# Patient Record
Sex: Female | Born: 1960 | Race: White | Hispanic: No | Marital: Married | Smoking: Never smoker
Health system: Southern US, Community
[De-identification: ages and names within clinical notes are randomized; demographics above are authoritative.]

## PROBLEM LIST (undated history)

## (undated) DIAGNOSIS — N189 Chronic kidney disease, unspecified: Secondary | ICD-10-CM

## (undated) DIAGNOSIS — I493 Ventricular premature depolarization: Secondary | ICD-10-CM

## (undated) DIAGNOSIS — R0789 Other chest pain: Secondary | ICD-10-CM

## (undated) HISTORY — DX: Chronic kidney disease, unspecified: N18.9

## (undated) HISTORY — DX: Other chest pain: R07.89

## (undated) HISTORY — DX: Ventricular premature depolarization: I49.3

---

## 1997-11-24 ENCOUNTER — Other Ambulatory Visit: Admission: RE | Admit: 1997-11-24 | Discharge: 1997-11-24 | Payer: Self-pay | Admitting: Obstetrics and Gynecology

## 1999-10-31 ENCOUNTER — Other Ambulatory Visit: Admission: RE | Admit: 1999-10-31 | Discharge: 1999-10-31 | Payer: Self-pay | Admitting: Obstetrics and Gynecology

## 2000-01-02 ENCOUNTER — Ambulatory Visit (HOSPITAL_COMMUNITY): Admission: RE | Admit: 2000-01-02 | Discharge: 2000-01-02 | Payer: Self-pay | Admitting: Family Medicine

## 2000-01-02 ENCOUNTER — Encounter: Payer: Self-pay | Admitting: Family Medicine

## 2000-10-08 ENCOUNTER — Emergency Department (HOSPITAL_COMMUNITY): Admission: EM | Admit: 2000-10-08 | Discharge: 2000-10-09 | Payer: Self-pay

## 2000-10-09 ENCOUNTER — Encounter: Payer: Self-pay | Admitting: Emergency Medicine

## 2000-11-14 ENCOUNTER — Other Ambulatory Visit: Admission: RE | Admit: 2000-11-14 | Discharge: 2000-11-14 | Payer: Self-pay | Admitting: Obstetrics and Gynecology

## 2001-12-11 ENCOUNTER — Other Ambulatory Visit: Admission: RE | Admit: 2001-12-11 | Discharge: 2001-12-11 | Payer: Self-pay | Admitting: Obstetrics and Gynecology

## 2002-11-12 ENCOUNTER — Other Ambulatory Visit: Admission: RE | Admit: 2002-11-12 | Discharge: 2002-11-12 | Payer: Self-pay | Admitting: Obstetrics and Gynecology

## 2003-11-09 ENCOUNTER — Other Ambulatory Visit: Admission: RE | Admit: 2003-11-09 | Discharge: 2003-11-09 | Payer: Self-pay | Admitting: Obstetrics and Gynecology

## 2005-01-02 ENCOUNTER — Other Ambulatory Visit: Admission: RE | Admit: 2005-01-02 | Discharge: 2005-01-02 | Payer: Self-pay | Admitting: Obstetrics and Gynecology

## 2005-01-17 ENCOUNTER — Encounter: Admission: RE | Admit: 2005-01-17 | Discharge: 2005-01-17 | Payer: Self-pay | Admitting: Obstetrics and Gynecology

## 2005-07-11 ENCOUNTER — Encounter (INDEPENDENT_AMBULATORY_CARE_PROVIDER_SITE_OTHER): Payer: Self-pay | Admitting: *Deleted

## 2005-07-11 ENCOUNTER — Encounter: Admission: RE | Admit: 2005-07-11 | Discharge: 2005-07-11 | Payer: Self-pay | Admitting: Obstetrics and Gynecology

## 2005-08-08 ENCOUNTER — Encounter (INDEPENDENT_AMBULATORY_CARE_PROVIDER_SITE_OTHER): Payer: Self-pay | Admitting: *Deleted

## 2005-08-08 ENCOUNTER — Ambulatory Visit (HOSPITAL_BASED_OUTPATIENT_CLINIC_OR_DEPARTMENT_OTHER): Admission: RE | Admit: 2005-08-08 | Discharge: 2005-08-08 | Payer: Self-pay | Admitting: Surgery

## 2005-08-08 ENCOUNTER — Encounter: Admission: RE | Admit: 2005-08-08 | Discharge: 2005-08-08 | Payer: Self-pay | Admitting: Surgery

## 2005-08-16 ENCOUNTER — Encounter: Admission: RE | Admit: 2005-08-16 | Discharge: 2005-08-16 | Payer: Self-pay | Admitting: Obstetrics and Gynecology

## 2005-09-17 ENCOUNTER — Encounter: Admission: RE | Admit: 2005-09-17 | Discharge: 2005-09-17 | Payer: Self-pay | Admitting: Surgery

## 2006-07-25 ENCOUNTER — Encounter: Admission: RE | Admit: 2006-07-25 | Discharge: 2006-07-25 | Payer: Self-pay | Admitting: Obstetrics and Gynecology

## 2007-10-12 ENCOUNTER — Encounter: Admission: RE | Admit: 2007-10-12 | Discharge: 2007-10-12 | Payer: Self-pay | Admitting: Obstetrics and Gynecology

## 2008-11-14 ENCOUNTER — Encounter: Admission: RE | Admit: 2008-11-14 | Discharge: 2008-11-14 | Payer: Self-pay | Admitting: Obstetrics and Gynecology

## 2010-01-29 ENCOUNTER — Encounter: Admission: RE | Admit: 2010-01-29 | Discharge: 2010-01-29 | Payer: Self-pay | Admitting: Obstetrics and Gynecology

## 2010-07-20 NOTE — Op Note (Signed)
NAME:  Paige Walker, Paige Walker               ACCOUNT NO.:  [id number]   MEDICAL RECORD NO.:  [id number]          PATIENT TYPE:  AMB   LOCATION:  DSC                          FACILITY:  [facility]   PHYSICIAN:  Currie Paris, M.D.DATE OF BIRTH:  [birth year]   DATE OF PROCEDURE:  08/08/2005  DATE OF DISCHARGE:                                 OPERATIVE REPORT   CCS#:  [id number].   PREOPERATIVE DIAGNOSES:  Left breast mass, cyst with a background of the  sclerotic lesion by  core biopsy.   POSTOPERATIVE DIAGNOSES:  Left breast mass, cyst with a background of the  sclerotic lesion by  core biopsy.   OPERATION:  Needle guided excision left breast mass.   SURGEON:  Dr. Jamey Ripa.   ANESTHESIA:  MAC.   CLINICAL HISTORY:  Ms. Wax is a 50 year old lady who recently had an  abnormality and an irregular cyst was found and the biopsy showed a  sclerosing lesion.  Excision was thought appropriate for confirmation that  this was benign.  She had no palpable mass.  A clip had been left in the  area at the time of her initial biopsy.   DESCRIPTION OF PROCEDURE:  The patient was seen in the holding area and she  had no further questions. We both identified the left breast and that was  marked as the operative side.  She already had her guidewire placed.   The guidewire entered fairly high at about the 12:30 position and tracked  deep and inferior.  Using the mammograms, the clip appeared to be a couple  of centimeters below the guidewire entry site.   The patient was then taken to the operating room and IV sedation given.  The  left breast was then prepped and draped as a sterile field.  The time-out  occurred.   I infiltrated the area with a combination 1% plain Xylocaine and 0.5%  Marcaine with epi mixed equally.  I started the areolar edge and infiltrated  it all the way up to the guidewire entry site and then deep into the breast  tissue.  I made the incision about a centimeter above the areolar  margin.  The guidewire actually entered about 5-6 cm above this.  This was as low as  I thought I could go and still safely reach and get the area in question out  by raising a skin flap.   Once the incision was made, I did raise a superficial skin flap up to the  guidewire and manipulated it into the wound.  Using an Allis for some  traction,  I then did an excisional biopsy taking the tissue out around the  guidewire starting superiorly and then working inferiorly and going both  medial and lateral. I thought that the area in question would be above my  skin incision as I thought I was well below that area so I was careful to  take all tissue anterior except for the small area left on the skin.   Once I got to where I thought I was past the guidewire, I completed the  excision by dividing the more inferior breast tissue and sent this for  specimen mammography.  I did put some orienting sutures on.  As I was taking  it out, I noticed what appeared to be some old hemorrhage at about the level  where I thought the prior biopsy had been and also seemed to be on the very  medial aspect of my specimen.   While waiting for the radiologist to do the specimen mammography, I went  ahead and took a little extra tissue right where the prior biopsy site  appeared to have been just so we had clear margins at that sites should any  pathology show malignancy.   Additional local had been infiltrated as I worked.  The bottom ore deep  portion of the incision was on the fascia but I did not take fascia.   At this point, I closed incision using some 3-0 Vicryl to close the very  deeper layers of the breast tissue over the muscle, some 3-0 Vicryl on the  subcu and then some 4-0 Monocryl subcuticular plus Dermabond for the skin.   The patient the tolerated procedure well.  There were no operative  complications.  All counts were correct.  Radiology called to report that  the specimen appeared  appropriate.      Currie Paris, M.D.  Electronically Signed     CJS/MEDQ  D:  08/08/2005  T:  08/09/2005  Job:  [id number]   cc:   Molly Maduro A. Nicholos Johns, M.D.  Fax: [phone]   Duke Salvia. Marcelle Overlie, M.D.  Fax: [phone]

## 2010-07-20 NOTE — Consult Note (Signed)
[facility]  Patient:    Paige Walker, Paige Walker                      MRN: [id number] Proc. Date: 10/09/00 Adm. Date:  [id number] Disc. Date: [id number] Attending:  Pearletha Alfred CC:         Duke Salvia. Marcelle Overlie, M.D.   Consultation Report  CHIEF COMPLAINT:  Abdominal pain.  HISTORY OF PRESENT ILLNESS:  This is a healthy 50 year old white female who was perfectly well until yesterday evening at 7:30 p.m. after dinner, when she developed the gradual onset of dull, diffuse, nonlocalized abdominal pain. This pain became progressive but remained nonlocalized.  She came to the emergency room at 10:30 p.m. last night and was evaluated by the emergency department physician.  She states that she was nauseated somewhat but did not vomit.  She states that the nausea has now resolved.  She states that her pain is much, much better now but still has "twinges" of pain in the right lower quadrant.  She states that she is not having any diarrhea, her bowel movements are infrequent but did have a bowel movement yesterday.  Denies fever or chills.  Denies vaginal discharge.  Last menstrual period two weeks ago.  She is not on birth control pills.  No prior episodes.  She had a CT scan during the night and that was interpreted by Dr. Ronney Asters as showing a somewhat distended appendix, possibly indicating appendicitis.  I have reviewed this CT scan myself and I am unsure.  I have reviewed the CT scan with Dr. Sherrine Maples T. Fredia Sorrow and the appendix is about 1.0 cm in diameter. There is a question of whether it is thick-walled.  There is no inflammatory change, there is no fluid collection and there are no other abnormalities on CT scan and it is felt that this CT scan is not diagnostic for appendicitis.  I was asked to see he by Dr. Estell Harpin.  PAST HISTORY:  Patient had a cesarean section in 1993.  She suffered a ruptured uterus and bladder in 1997 during a vaginal delivery which  required laparotomy by Dr. Duke Salvia. [place] and Dr. Excell Seltzer. [place].  She a tonsillectomy.  She had lithotripsy x 2 for kidney stones.  She has a history of nasal surgery.  She had a kidney biopsy for nephritis at age 38 but that is not an ongoing problem.  CURRENT MEDICATIONS:  Wellbutrin.  DRUG ALLERGIES:  None known.  FAMILY HISTORY:  Mother living and has osteoporosis.  Father died at age 56 of congestive heart failure.  All siblings are well.  SOCIAL HISTORY:  The patient is married and has two children.  She is a housewife.  She denies the use of tobacco.  Drinks alcohol occasionally.  REVIEW OF SYSTEMS:  She does have some fatty food intolerance which causes cramps and diarrhea.  PHYSICAL EXAMINATION:  GENERAL:  Very healthy young woman in no distress whatsoever.  She moves about easily and does not appear to be in pain.  VITAL SIGNS:  Temperature 97.3, respirations 20, pulse 78, blood pressure 114/73.  HEENT:  Sclerae clear.  Extraocular movements intact.  Oropharynx clear.  NECK:  Supple.  Nontender.  No masses, no adenopathy, no jugular venous distention.  LUNGS:  Clear to auscultation.  No CVA tenderness.  HEART:  Regular rate and rhythm.  No murmur.  BREASTS:  Not examined.  ABDOMEN:  Scaphoid, quit soft.  Active bowel sounds.  There is a well-healed short Pfannenstiel incision below the hair line.  She has some subjective tenderness in the right lower quadrant but objectively, she is not very tender.  There is no guarding, no rebound, no mass, no hernia.  Abdominal exam is not very impressive.  EXTREMITIES:  No edema.  Good pulses.  NEUROLOGIC:  Grossly within normal limits.  LABORATORY DATA:  Urine pregnancy test negative.  Urinalysis clear, no blood. WBC elevated at 16,700.  Complete metabolic panel normal.  CT scan as described above.  IMPRESSION:  Abdominal pain of uncertain etiology.  History and exam are atypical for appendicitis.  Question  whether this may be a gastroenteritis or a ruptured corpus luteum cyst.  PLAN:  I advised the patient and her husband that I thought that appendicitis was not likely, although I could not completely rule it out.  I advised her that the best course of action at this time would be observation and that could be done in the hospital or at home, since they live close by.  She stated that she was feeling much better and would like to go home and I felt that that was reasonably safe.  She is advised to stay on a liquid diet for the next 12 hours and to take Tylenol for pain.  She is advised to call me or return to the emergency room if symptoms recur.  If she does not resolve, she certainly needs to be considered for a diagnostic laparoscopy.  If her symptoms completely resolve, she may follow up with me p.r.n. DD:  10/09/00 TD:  10/09/00 Job: [id number]

## 2011-01-14 ENCOUNTER — Other Ambulatory Visit: Payer: Self-pay | Admitting: Obstetrics and Gynecology

## 2011-01-14 DIAGNOSIS — Z1231 Encounter for screening mammogram for malignant neoplasm of breast: Secondary | ICD-10-CM

## 2011-02-07 ENCOUNTER — Ambulatory Visit
Admission: RE | Admit: 2011-02-07 | Discharge: 2011-02-07 | Disposition: A | Discharge status: Home / Self Care | Payer: BC Managed Care – PPO | Source: Ambulatory Visit | Attending: Obstetrics and Gynecology | Admitting: Obstetrics and Gynecology

## 2011-02-07 DIAGNOSIS — Z1231 Encounter for screening mammogram for malignant neoplasm of breast: Secondary | ICD-10-CM

## 2012-03-24 ENCOUNTER — Other Ambulatory Visit: Payer: Self-pay | Admitting: Obstetrics and Gynecology

## 2012-03-24 DIAGNOSIS — Z9889 Other specified postprocedural states: Secondary | ICD-10-CM

## 2012-03-24 DIAGNOSIS — Z1231 Encounter for screening mammogram for malignant neoplasm of breast: Secondary | ICD-10-CM

## 2012-03-25 ENCOUNTER — Ambulatory Visit
Admission: RE | Admit: 2012-03-25 | Discharge: 2012-03-25 | Disposition: A | Discharge status: Home / Self Care | Payer: BC Managed Care – PPO | Source: Ambulatory Visit | Attending: Obstetrics and Gynecology | Admitting: Obstetrics and Gynecology

## 2012-03-25 DIAGNOSIS — Z1231 Encounter for screening mammogram for malignant neoplasm of breast: Secondary | ICD-10-CM

## 2012-03-25 DIAGNOSIS — Z9889 Other specified postprocedural states: Secondary | ICD-10-CM

## 2012-03-27 ENCOUNTER — Other Ambulatory Visit: Payer: Self-pay | Admitting: Obstetrics and Gynecology

## 2012-03-27 DIAGNOSIS — R928 Other abnormal and inconclusive findings on diagnostic imaging of breast: Secondary | ICD-10-CM

## 2012-04-01 ENCOUNTER — Ambulatory Visit
Admission: RE | Admit: 2012-04-01 | Discharge: 2012-04-01 | Disposition: A | Discharge status: Home / Self Care | Payer: BC Managed Care – PPO | Source: Ambulatory Visit | Attending: Obstetrics and Gynecology | Admitting: Obstetrics and Gynecology

## 2012-04-01 DIAGNOSIS — R928 Other abnormal and inconclusive findings on diagnostic imaging of breast: Secondary | ICD-10-CM

## 2012-04-10 ENCOUNTER — Other Ambulatory Visit: Payer: BC Managed Care – PPO

## 2012-04-17 ENCOUNTER — Ambulatory Visit: Payer: BC Managed Care – PPO

## 2013-10-17 ENCOUNTER — Encounter: Payer: Self-pay | Admitting: *Deleted

## 2014-04-26 ENCOUNTER — Other Ambulatory Visit: Payer: Self-pay

## 2014-04-26 DIAGNOSIS — Z1231 Encounter for screening mammogram for malignant neoplasm of breast: Secondary | ICD-10-CM

## 2014-05-02 ENCOUNTER — Encounter (INDEPENDENT_AMBULATORY_CARE_PROVIDER_SITE_OTHER): Payer: Self-pay

## 2014-05-02 ENCOUNTER — Ambulatory Visit
Admission: RE | Admit: 2014-05-02 | Discharge: 2014-05-02 | Disposition: A | Discharge status: Home / Self Care | Payer: BLUE CROSS/BLUE SHIELD | Source: Ambulatory Visit

## 2014-05-02 DIAGNOSIS — Z1231 Encounter for screening mammogram for malignant neoplasm of breast: Secondary | ICD-10-CM

## 2016-01-02 ENCOUNTER — Other Ambulatory Visit: Payer: Self-pay | Admitting: Obstetrics and Gynecology

## 2016-01-02 DIAGNOSIS — Z1231 Encounter for screening mammogram for malignant neoplasm of breast: Secondary | ICD-10-CM

## 2016-01-31 ENCOUNTER — Ambulatory Visit
Admission: RE | Admit: 2016-01-31 | Discharge: 2016-01-31 | Disposition: A | Discharge status: Home / Self Care | Payer: BLUE CROSS/BLUE SHIELD | Source: Ambulatory Visit | Attending: Obstetrics and Gynecology | Admitting: Obstetrics and Gynecology

## 2016-01-31 DIAGNOSIS — Z1231 Encounter for screening mammogram for malignant neoplasm of breast: Secondary | ICD-10-CM

## 2020-04-27 ENCOUNTER — Other Ambulatory Visit: Payer: Self-pay | Admitting: Family Medicine

## 2020-04-27 DIAGNOSIS — Z8249 Family history of ischemic heart disease and other diseases of the circulatory system: Secondary | ICD-10-CM

## 2020-04-28 ENCOUNTER — Other Ambulatory Visit: Payer: Self-pay | Admitting: Family Medicine

## 2020-04-28 DIAGNOSIS — Z0000 Encounter for general adult medical examination without abnormal findings: Secondary | ICD-10-CM

## 2020-05-09 ENCOUNTER — Ambulatory Visit
Admission: RE | Admit: 2020-05-09 | Discharge: 2020-05-09 | Disposition: A | Discharge status: Home / Self Care | Payer: PRIVATE HEALTH INSURANCE | Source: Ambulatory Visit | Attending: Family Medicine | Admitting: Family Medicine

## 2020-05-09 DIAGNOSIS — Z8249 Family history of ischemic heart disease and other diseases of the circulatory system: Secondary | ICD-10-CM

## 2020-06-14 ENCOUNTER — Other Ambulatory Visit (HOSPITAL_COMMUNITY)
Admission: RE | Admit: 2020-06-14 | Discharge: 2020-06-14 | Disposition: A | Discharge status: Home / Self Care | Payer: PRIVATE HEALTH INSURANCE | Source: Ambulatory Visit | Attending: Family Medicine | Admitting: Family Medicine

## 2020-06-14 DIAGNOSIS — Z1151 Encounter for screening for human papillomavirus (HPV): Secondary | ICD-10-CM | POA: Insufficient documentation

## 2020-06-14 DIAGNOSIS — Z01419 Encounter for gynecological examination (general) (routine) without abnormal findings: Secondary | ICD-10-CM | POA: Diagnosis not present

## 2020-06-14 DIAGNOSIS — Z0000 Encounter for general adult medical examination without abnormal findings: Secondary | ICD-10-CM | POA: Diagnosis present

## 2020-06-16 LAB — CYTOLOGY - PAP
Comment: NEGATIVE
Diagnosis: NEGATIVE
High risk HPV: NEGATIVE

## 2020-06-20 ENCOUNTER — Ambulatory Visit
Admission: RE | Admit: 2020-06-20 | Discharge: 2020-06-20 | Disposition: A | Discharge status: Home / Self Care | Payer: PRIVATE HEALTH INSURANCE | Source: Ambulatory Visit | Attending: Family Medicine | Admitting: Family Medicine

## 2020-06-20 ENCOUNTER — Other Ambulatory Visit: Payer: Self-pay

## 2020-06-20 DIAGNOSIS — Z0000 Encounter for general adult medical examination without abnormal findings: Secondary | ICD-10-CM

## 2020-07-21 ENCOUNTER — Emergency Department (HOSPITAL_COMMUNITY): Payer: PRIVATE HEALTH INSURANCE

## 2020-07-21 ENCOUNTER — Other Ambulatory Visit: Payer: Self-pay

## 2020-07-21 ENCOUNTER — Emergency Department (HOSPITAL_COMMUNITY)
Admission: EM | Admit: 2020-07-21 | Discharge: 2020-07-21 | Disposition: A | Discharge status: Home / Self Care | Payer: PRIVATE HEALTH INSURANCE | Attending: Emergency Medicine | Admitting: Emergency Medicine

## 2020-07-21 ENCOUNTER — Encounter (HOSPITAL_COMMUNITY): Payer: Self-pay | Admitting: Emergency Medicine

## 2020-07-21 DIAGNOSIS — S61411A Laceration without foreign body of right hand, initial encounter: Secondary | ICD-10-CM | POA: Insufficient documentation

## 2020-07-21 DIAGNOSIS — S0083XA Contusion of other part of head, initial encounter: Secondary | ICD-10-CM | POA: Diagnosis not present

## 2020-07-21 DIAGNOSIS — N189 Chronic kidney disease, unspecified: Secondary | ICD-10-CM | POA: Diagnosis not present

## 2020-07-21 DIAGNOSIS — Y92009 Unspecified place in unspecified non-institutional (private) residence as the place of occurrence of the external cause: Secondary | ICD-10-CM | POA: Diagnosis not present

## 2020-07-21 DIAGNOSIS — W010XXA Fall on same level from slipping, tripping and stumbling without subsequent striking against object, initial encounter: Secondary | ICD-10-CM | POA: Insufficient documentation

## 2020-07-21 DIAGNOSIS — W19XXXA Unspecified fall, initial encounter: Secondary | ICD-10-CM

## 2020-07-21 DIAGNOSIS — S6991XA Unspecified injury of right wrist, hand and finger(s), initial encounter: Secondary | ICD-10-CM | POA: Diagnosis present

## 2020-07-21 MED ORDER — CEPHALEXIN 250 MG PO CAPS
500.0000 mg | ORAL_CAPSULE | Freq: Once | ORAL | Status: AC
  Administered 2020-07-21: 500 mg via ORAL
  Filled 2020-07-21: qty 2

## 2020-07-21 MED ORDER — LIDOCAINE-EPINEPHRINE (PF) 2 %-1:200000 IJ SOLN
10.0000 mL | Freq: Once | INTRAMUSCULAR | Status: AC
  Administered 2020-07-21: 10 mL via INTRADERMAL
  Filled 2020-07-21: qty 20

## 2020-07-21 MED ORDER — OXYCODONE-ACETAMINOPHEN 5-325 MG PO TABS
1.0000 | ORAL_TABLET | Freq: Once | ORAL | Status: AC
  Administered 2020-07-21: 1 via ORAL
  Filled 2020-07-21: qty 1

## 2020-07-21 MED ORDER — HYDROMORPHONE HCL 1 MG/ML IJ SOLN
1.0000 mg | Freq: Once | INTRAMUSCULAR | Status: AC
  Administered 2020-07-21: 1 mg via INTRAMUSCULAR
  Filled 2020-07-21: qty 1

## 2020-07-21 MED ORDER — BACITRACIN ZINC 500 UNIT/GM EX OINT
TOPICAL_OINTMENT | Freq: Once | CUTANEOUS | Status: AC
  Filled 2020-07-21: qty 0.9

## 2020-07-21 MED ORDER — CEPHALEXIN 500 MG PO CAPS: 500.0000 mg | ORAL_CAPSULE | Freq: Four times a day (QID) | ORAL | 0 refills | Status: AC

## 2020-07-21 MED ORDER — TETANUS-DIPHTH-ACELL PERTUSSIS 5-2.5-18.5 LF-MCG/0.5 IM SUSY
0.5000 mL | PREFILLED_SYRINGE | Freq: Once | INTRAMUSCULAR | Status: DC
  Filled 2020-07-21: qty 0.5

## 2020-07-21 MED ORDER — HYDROCODONE-ACETAMINOPHEN 5-325 MG PO TABS
1.0000 | ORAL_TABLET | Freq: Once | ORAL | Status: AC
  Administered 2020-07-21: 1 via ORAL
  Filled 2020-07-21: qty 1

## 2020-07-21 NOTE — ED Triage Notes (Signed)
Patient tripped and fell at home this evening with brief LOC , presents with multiple skin lacerations at right palm sustained from a glass that she was holding .

## 2020-07-21 NOTE — ED Provider Notes (Signed)
MSE was initiated and I personally evaluated the patient and placed orders (if any) at  1:27 AM on Jul 21, 2020.  Patient fell while walking with a glass in hand causing the glass to shatter lacerating the hand. She has facial bruising and reports LOC of a few minutes. No nausea. No visual changes.   Today's Vitals   07/21/20 0108 07/21/20 0114  BP: 130/81   Pulse: 92   Resp: 16   Temp: 98.8 F (37.1 C)   TempSrc: Oral   SpO2: 97%   PainSc:  0-No pain   There is no height or weight on file to calculate BMI.  Multiple hand lacerations Facial bruising over right chin and cheek.  No malocclusion   The patient appears stable so that the remainder of the MSE may be completed by another provider.   Elpidio Anis, PA-C 07/21/20 0129    Dione Booze, MD 07/21/20 [phone]

## 2020-07-21 NOTE — ED Provider Notes (Signed)
[facility] EMERGENCY DEPARTMENT Provider Note   CSN: [id number] Arrival date & time: 07/21/20  0053     History Chief Complaint  Patient presents with  . Fall / Hand Lacerations    Paige Walker is a 60 y.o. female.  HPI 60 year old female with no pertinent medical history presents emergency department for evaluation after a fall.  States she tripped over her dishwasher.  Does think she lost consciousness.  Has bruising to her right lower chin and right side of her face.  Currently has pain to the swollen area that is moderate and has been constant since onset.  Movement makes it worse, rest makes it better.  Has not taken anything for the pain.  Denies history of injury like this previously.  Additionally, stated that she held a glass in her hand and it shattered.  Has multiple lacerations to her right hand.  States she is right-handed.  States he has trouble flexing her right thumb.  Denies history of episode like this previously.  Uncertain when last Tdap was.    Past Medical History:  Diagnosis Date  . Chronic kidney disease   . Non-cardiac chest pain   . PVC (premature ventricular contraction)     There are no problems to display for this patient.   History reviewed. No pertinent surgical history.   OB History   No obstetric history on file.     No family history on file.  Social History   Tobacco Use  . Smoking status: Never Smoker  . Smokeless tobacco: Never Used  Substance Use Topics  . Alcohol use: No  . Drug use: No    Home Medications Prior to Admission medications   Medication Sig Start Date End Date Taking? Authorizing Provider  cephALEXin (KEFLEX) 500 MG capsule Take 1 capsule (500 mg total) by mouth 4 (four) times daily for 7 days. 07/21/20 07/28/20 Yes Joya Willmott, Penni Bombard, DO  ALPRAZolam Prudy Feeler) 0.5 MG tablet Take 0.5 mg by mouth at bedtime as needed for anxiety.    [provider]    Allergies    Bupropion  Review  of Systems   Review of Systems  Constitutional: Negative for chills and fever.  HENT: Positive for facial swelling. Negative for ear pain and sore throat.   Eyes: Negative for pain and visual disturbance.  Respiratory: Negative for cough and shortness of breath.   Cardiovascular: Negative for chest pain and palpitations.  Gastrointestinal: Negative for abdominal pain and vomiting.  Genitourinary: Negative for dysuria and hematuria.  Musculoskeletal: Negative for arthralgias and back pain.  Skin: Positive for color change and wound. Negative for rash.  Neurological: Negative for seizures and syncope.  All other systems reviewed and are negative.   Physical Exam Updated Vital Signs BP 108/68   Pulse 75   Temp 98.3 F (36.8 C) (Oral)   Resp 19   LMP 03/17/2012   SpO2 97%   Physical Exam Vitals and nursing note reviewed.  Constitutional:      General: She is not in acute distress.    Appearance: She is well-developed.  HENT:     Head: Normocephalic and atraumatic.     Comments: Swelling to right side of face associated with bruising along with hematoma to right lower jaw.    Right Ear: External ear normal.     Left Ear: External ear normal.     Nose: Nose normal.     Mouth/Throat:     Mouth: Mucous membranes are moist.  Comments: Dried blood around corner of mouth without obvious intraoral trauma.  No loose teeth, step-offs, or mouth wounds.  Jaw lines up normally per patient. Eyes:     Extraocular Movements: Extraocular movements intact.     Conjunctiva/sclera: Conjunctivae normal.     Pupils: Pupils are equal, round, and reactive to light.  Cardiovascular:     Rate and Rhythm: Normal rate and regular rhythm.     Pulses: Normal pulses.     Heart sounds: No murmur heard.   Pulmonary:     Effort: Pulmonary effort is normal. No respiratory distress.     Breath sounds: Normal breath sounds.  Abdominal:     Palpations: Abdomen is soft.     Tenderness: There is no  abdominal tenderness.  Musculoskeletal:     Cervical back: Normal range of motion and neck supple. No tenderness.     Comments: Swelling to right thenar eminence.  Multiple lacerations to hand.  Long linear laceration/abrasion that is self closed down middle of palm.  Horizontal 4 cm deep abrasion with overlying skin to proximal palm.  1.3 cm linear laceration at base of thumb overlying thenar eminence swelling.  V-shaped 2 cm laceration to right medial thumb interphalangeal joint.  Patient can AB duct thumb, but cannot flex at MCP or interphalangeal joint.  Sensation intact.  Capillary refill is intact in all fingers.  Patient has full range of motion of all other fingers and wrist.  Skin:    General: Skin is warm and dry.     Capillary Refill: Capillary refill takes less than 2 seconds.  Neurological:     Mental Status: She is alert and oriented to person, place, and time.     Cranial Nerves: No cranial nerve deficit.     Sensory: No sensory deficit.     Motor: No weakness.     Coordination: Coordination normal.  Psychiatric:     Comments: Mood is anxious, affect is congruent.  Patient is pleasant and interactive.     ED Results / Procedures / Treatments   Labs (all labs ordered are listed, but only abnormal results are displayed) Labs Reviewed - No data to display  EKG None  Radiology CT Head Wo Contrast  Result Date: 07/21/2020 CLINICAL DATA:  Fall EXAM: CT HEAD WITHOUT CONTRAST CT MAXILLOFACIAL WITHOUT CONTRAST CT CERVICAL SPINE WITHOUT CONTRAST TECHNIQUE: Multidetector CT imaging of the head, cervical spine, and maxillofacial structures were performed using the standard protocol without intravenous contrast. Multiplanar CT image reconstructions of the cervical spine and maxillofacial structures were also generated. COMPARISON:  None. FINDINGS: CT HEAD FINDINGS Brain: There is no mass, hemorrhage or extra-axial collection. The size and configuration of the ventricles and extra-axial  CSF spaces are normal. The brain parenchyma is normal, without evidence of acute or chronic infarction. Vascular: No abnormal hyperdensity of the major intracranial arteries or dural venous sinuses. No intracranial atherosclerosis. Skull: The visualized skull base, calvarium and extracranial soft tissues are normal. CT MAXILLOFACIAL FINDINGS Osseous: --Complex facial fracture types: No LeFort, zygomaticomaxillary complex or nasoorbitoethmoidal fracture. --Simple fracture types: None. --Mandible: No fracture or dislocation. Orbits: The globes are intact. Normal appearance of the intra- and extraconal fat. Symmetric extraocular muscles and optic nerves. Sinuses: No fluid levels or advanced mucosal thickening. Soft tissues: Normal visualized extracranial soft tissues. CT CERVICAL SPINE FINDINGS Alignment: No static subluxation. Facets are aligned. Occipital condyles and the lateral masses of C1-C2 are aligned. Skull base and vertebrae: No acute fracture. Soft tissues and spinal canal:  No prevertebral fluid or swelling. No visible canal hematoma. Disc levels: No advanced spinal canal or neural foraminal stenosis. Upper chest: No pneumothorax, pulmonary nodule or pleural effusion. Other: Normal visualized paraspinal cervical soft tissues. IMPRESSION: 1. No acute intracranial abnormality. 2. No facial fracture. 3. No acute fracture or static subluxation of the cervical spine. Electronically Signed   By: Deatra Robinson M.D.   On: 07/21/2020 02:07   CT Cervical Spine Wo Contrast  Result Date: 07/21/2020 CLINICAL DATA:  Fall EXAM: CT HEAD WITHOUT CONTRAST CT MAXILLOFACIAL WITHOUT CONTRAST CT CERVICAL SPINE WITHOUT CONTRAST TECHNIQUE: Multidetector CT imaging of the head, cervical spine, and maxillofacial structures were performed using the standard protocol without intravenous contrast. Multiplanar CT image reconstructions of the cervical spine and maxillofacial structures were also generated. COMPARISON:  None. FINDINGS:  CT HEAD FINDINGS Brain: There is no mass, hemorrhage or extra-axial collection. The size and configuration of the ventricles and extra-axial CSF spaces are normal. The brain parenchyma is normal, without evidence of acute or chronic infarction. Vascular: No abnormal hyperdensity of the major intracranial arteries or dural venous sinuses. No intracranial atherosclerosis. Skull: The visualized skull base, calvarium and extracranial soft tissues are normal. CT MAXILLOFACIAL FINDINGS Osseous: --Complex facial fracture types: No LeFort, zygomaticomaxillary complex or nasoorbitoethmoidal fracture. --Simple fracture types: None. --Mandible: No fracture or dislocation. Orbits: The globes are intact. Normal appearance of the intra- and extraconal fat. Symmetric extraocular muscles and optic nerves. Sinuses: No fluid levels or advanced mucosal thickening. Soft tissues: Normal visualized extracranial soft tissues. CT CERVICAL SPINE FINDINGS Alignment: No static subluxation. Facets are aligned. Occipital condyles and the lateral masses of C1-C2 are aligned. Skull base and vertebrae: No acute fracture. Soft tissues and spinal canal: No prevertebral fluid or swelling. No visible canal hematoma. Disc levels: No advanced spinal canal or neural foraminal stenosis. Upper chest: No pneumothorax, pulmonary nodule or pleural effusion. Other: Normal visualized paraspinal cervical soft tissues. IMPRESSION: 1. No acute intracranial abnormality. 2. No facial fracture. 3. No acute fracture or static subluxation of the cervical spine. Electronically Signed   By: Deatra Robinson M.D.   On: 07/21/2020 02:07   DG Hand Complete Right  Result Date: 07/21/2020 CLINICAL DATA:  Laceration with possible glass foreign body. EXAM: RIGHT HAND - COMPLETE 3+ VIEW COMPARISON:  None. FINDINGS: There is no evidence of fracture or dislocation. There is no evidence of arthropathy or other focal bone abnormality. Soft tissues are unremarkable. IMPRESSION:  Negative. Electronically Signed   By: Deatra Robinson M.D.   On: 07/21/2020 02:13   CT Maxillofacial Wo Contrast  Result Date: 07/21/2020 CLINICAL DATA:  Fall EXAM: CT HEAD WITHOUT CONTRAST CT MAXILLOFACIAL WITHOUT CONTRAST CT CERVICAL SPINE WITHOUT CONTRAST TECHNIQUE: Multidetector CT imaging of the head, cervical spine, and maxillofacial structures were performed using the standard protocol without intravenous contrast. Multiplanar CT image reconstructions of the cervical spine and maxillofacial structures were also generated. COMPARISON:  None. FINDINGS: CT HEAD FINDINGS Brain: There is no mass, hemorrhage or extra-axial collection. The size and configuration of the ventricles and extra-axial CSF spaces are normal. The brain parenchyma is normal, without evidence of acute or chronic infarction. Vascular: No abnormal hyperdensity of the major intracranial arteries or dural venous sinuses. No intracranial atherosclerosis. Skull: The visualized skull base, calvarium and extracranial soft tissues are normal. CT MAXILLOFACIAL FINDINGS Osseous: --Complex facial fracture types: No LeFort, zygomaticomaxillary complex or nasoorbitoethmoidal fracture. --Simple fracture types: None. --Mandible: No fracture or dislocation. Orbits: The globes are intact. Normal appearance of the  intra- and extraconal fat. Symmetric extraocular muscles and optic nerves. Sinuses: No fluid levels or advanced mucosal thickening. Soft tissues: Normal visualized extracranial soft tissues. CT CERVICAL SPINE FINDINGS Alignment: No static subluxation. Facets are aligned. Occipital condyles and the lateral masses of C1-C2 are aligned. Skull base and vertebrae: No acute fracture. Soft tissues and spinal canal: No prevertebral fluid or swelling. No visible canal hematoma. Disc levels: No advanced spinal canal or neural foraminal stenosis. Upper chest: No pneumothorax, pulmonary nodule or pleural effusion. Other: Normal visualized paraspinal cervical  soft tissues. IMPRESSION: 1. No acute intracranial abnormality. 2. No facial fracture. 3. No acute fracture or static subluxation of the cervical spine. Electronically Signed   By: Deatra Robinson M.D.   On: 07/21/2020 02:07    Procedures .Marland KitchenLaceration Repair  Date/Time: 07/21/2020 11:35 AM Performed by: Louretta Parma, DO Authorized by: Milagros Loll, MD   Consent:    Consent obtained:  Verbal   Risks, benefits, and alternatives were discussed: yes     Alternatives discussed:  No treatment and delayed treatment Universal protocol:    Patient identity confirmed:  Verbally with patient and hospital-assigned identification number Anesthesia:    Anesthesia method:  Local infiltration   Local anesthetic:  Lidocaine 2% WITH epi Laceration details:    Location:  Hand   Hand location:  R palm   Length (cm):  6   Depth (mm):  3 Pre-procedure details:    Preparation:  Patient was prepped and draped in usual sterile fashion and imaging obtained to evaluate for foreign bodies Exploration:    Limited defect created (wound extended): no     Hemostasis achieved with:  Epinephrine   Imaging obtained: x-ray     Imaging outcome: foreign body not noted     Wound exploration: wound explored through full range of motion and entire depth of wound visualized     Contaminated: no   Treatment:    Area cleansed with:  Soap and water   Amount of cleaning:  Extensive   Irrigation solution:  Sterile saline and tap water   Irrigation volume:  1L   Irrigation method:  Syringe   Visualized foreign bodies/material removed: no     Debridement:  Minimal   Undermining:  None   Scar revision: no   Skin repair:    Repair method:  Sutures   Suture size:  5-0   Suture material:  Chromic gut   Suture technique:  Simple interrupted   Number of sutures:  12 Approximation:    Approximation:  Close Repair type:    Repair type:  Intermediate Post-procedure details:    Dressing:  Antibiotic ointment,  splint for protection and non-adherent dressing   Procedure completion:  Tolerated well, no immediate complications     Medications Ordered in ED Medications  Tdap (BOOSTRIX) injection 0.5 mL (0.5 mLs Intramuscular Patient Refused/Not Given 07/21/20 0912)  oxyCODONE-acetaminophen (PERCOCET/ROXICET) 5-325 MG per tablet 1 tablet (1 tablet Oral Given 07/21/20 0247)  HYDROmorphone (DILAUDID) injection 1 mg (1 mg Intramuscular Given 07/21/20 0855)  lidocaine-EPINEPHrine (XYLOCAINE W/EPI) 2 %-1:200000 (PF) injection 10 mL (10 mLs Intradermal Given by Other 07/21/20 0944)  HYDROcodone-acetaminophen (NORCO/VICODIN) 5-325 MG per tablet 1 tablet (1 tablet Oral Given 07/21/20 1121)  bacitracin ointment ( Topical Given 07/21/20 1123)  cephALEXin (KEFLEX) capsule 500 mg (500 mg Oral Given 07/21/20 1122)    ED Course  I have reviewed the triage vital signs and the nursing notes.  Pertinent labs & imaging results that were  available during my care of the patient were reviewed by me and considered in my medical decision making (see chart for details).    MDM Rules/Calculators/A&P                          60 year old female presents for evaluation after a fall.  Vital signs are stable, patient is not in acute distress.  Exam shows no focal neurologic deficit.  She does have significant lacerations to right palmar surface along with decreased range of motion in right thumb flexion.  Is otherwise neurovascularly intact.  Abdomen soft nontender.  She has bilateral breath sounds.  Imaging obtained in triage showed no acute fracture or dislocation.  No foreign object seen in right hand on x-ray.  CT head, maxillofacial and neck CT without acute abnormality.  No signs of fracture, dislocation, compartment syndrome, or severe traumatic brain injury.  Given IM Dilaudid and Norco for pain with improvement.  Tdap updated.  Lacerations were repaired as above.  I spoke to orthopedic surgery, Dale Waterbury, patient  stable for close outpatient follow-up.  We discussed this with patient.  She feels comfortable with this plan.  We discussed symptomatic management and return precautions.  Patient discharged in stable condition.    Final Clinical Impression(s) / ED Diagnoses Final diagnoses:  Fall, initial encounter  Laceration of right hand, foreign body presence unspecified, initial encounter  Facial hematoma, initial encounter    Rx / DC Orders ED Discharge Orders         Ordered    cephALEXin (KEFLEX) 500 MG capsule  4 times daily        07/21/20 1113           [place], [place], DO 07/21/20 1329    Milagros Loll, MD 07/26/20 [id number]

## 2020-07-21 NOTE — Progress Notes (Signed)
Orthopedic Tech Progress Note Patient Details:  Anai Lipson [facility] Jan 06, 1961 [id number]  Ortho Devices Type of Ortho Device: Thumb velcro splint Ortho Device/Splint Location: RUE Ortho Device/Splint Interventions: Ordered,Application   Post Interventions Patient Tolerated: Well Instructions Provided: Adjustment of device   Maurene Capes 07/21/2020, 11:46 AM

## 2020-07-21 NOTE — ED Notes (Signed)
Pt discharged home per MD order. Discharge summary reviewed with pt, pt verbalizes understanding. Reports husband is discharge ride home. No s/s of acute distress noted at discharge.

## 2020-07-21 NOTE — Discharge Instructions (Signed)
Keep wounds clean and dry. Call hand surgeon's office to schedule close follow-up.  Dr. Amanda Pea is not working today.  Per surgery, they are okay with you following up in the next few days and do not believe there is a reason for rush.  If you would prefer to be seen today, you can ask for another provider in the office. Start taking Keflex 4 times daily for 7 days.  You can stop if hand surgery recommends stopping antibiotics, otherwise finish the course. Wear your hand splint at all times, this will provide comfort. Elevate your extremity above the level of your heart anytime you are sitting down.  Ice can help with pain and swelling as well. Alternate ibuprofen and Tylenol as needed for hand pain.

## 2021-03-15 DIAGNOSIS — M79641 Pain in right hand: Secondary | ICD-10-CM | POA: Diagnosis not present

## 2021-03-29 DIAGNOSIS — M79641 Pain in right hand: Secondary | ICD-10-CM | POA: Diagnosis not present

## 2021-04-03 DIAGNOSIS — M79641 Pain in right hand: Secondary | ICD-10-CM | POA: Diagnosis not present

## 2021-04-05 DIAGNOSIS — M79641 Pain in right hand: Secondary | ICD-10-CM | POA: Diagnosis not present

## 2021-04-11 DIAGNOSIS — M79641 Pain in right hand: Secondary | ICD-10-CM | POA: Diagnosis not present

## 2021-05-10 DIAGNOSIS — M79641 Pain in right hand: Secondary | ICD-10-CM | POA: Diagnosis not present

## 2021-06-05 IMAGING — US US AORTA
1 series · 14 of 16 positions shown · non-contrast
Comparison: None.

CLINICAL DATA: Family history of abdominal aortic aneurysms.

EXAM:
ULTRASOUND OF ABDOMINAL AORTA
TECHNIQUE: Ultrasound examination of the abdominal aorta and proximal common
iliac arteries was performed to evaluate for aneurysm. Additional
color and Doppler images of the distal aorta were obtained to
document patency.

[Series 1: us aorta · 0.19mm/px · 14 of 16 slices shown]
[im 1/16]
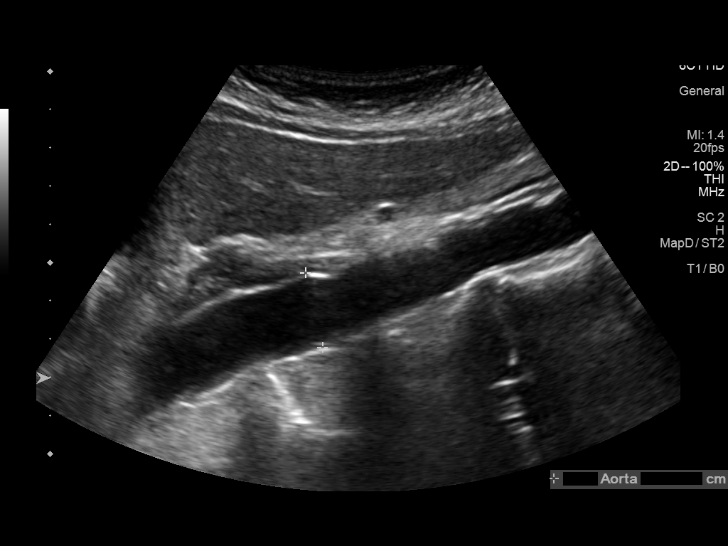
[im 2/16]
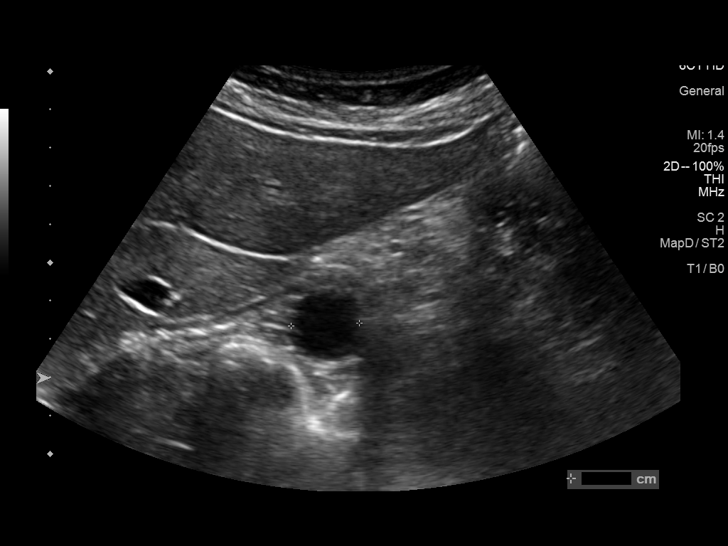
[im 3/16]
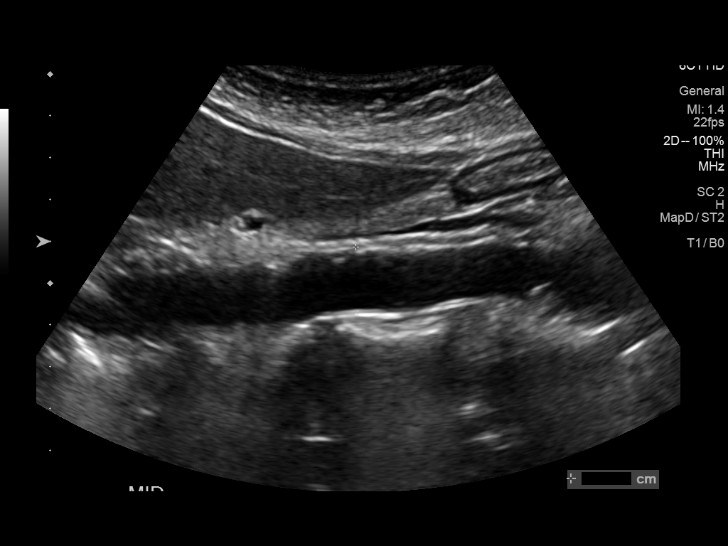
[im 5/16]
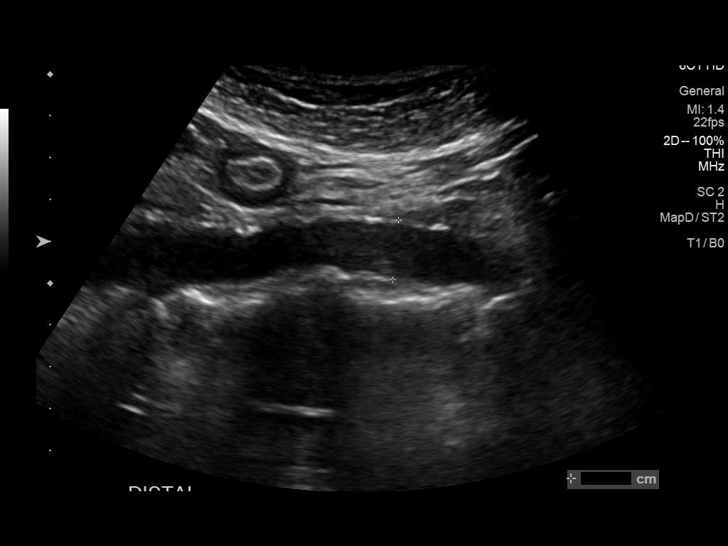
[im 6/16]
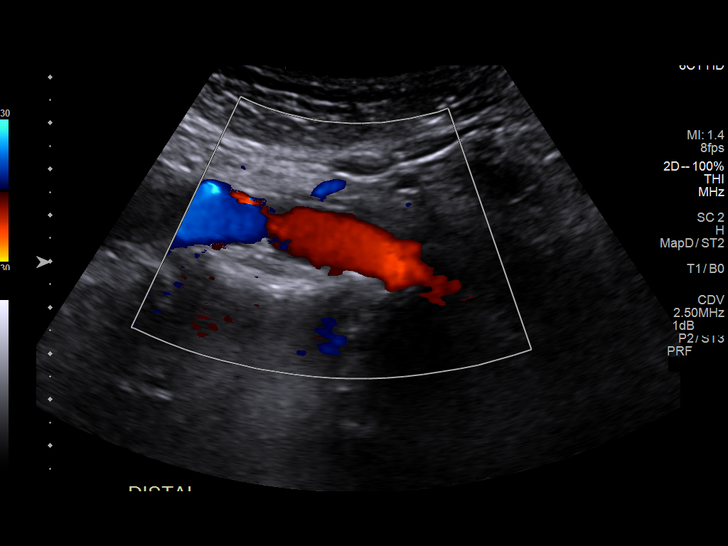
[im 7/16]
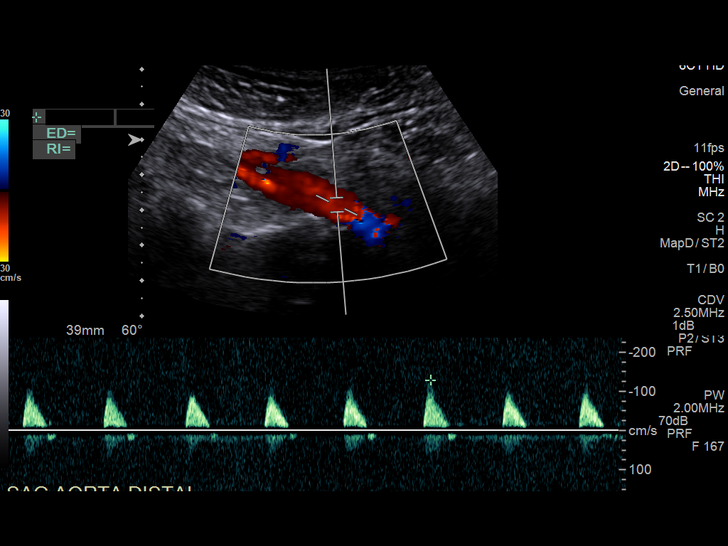
[im 8/16]
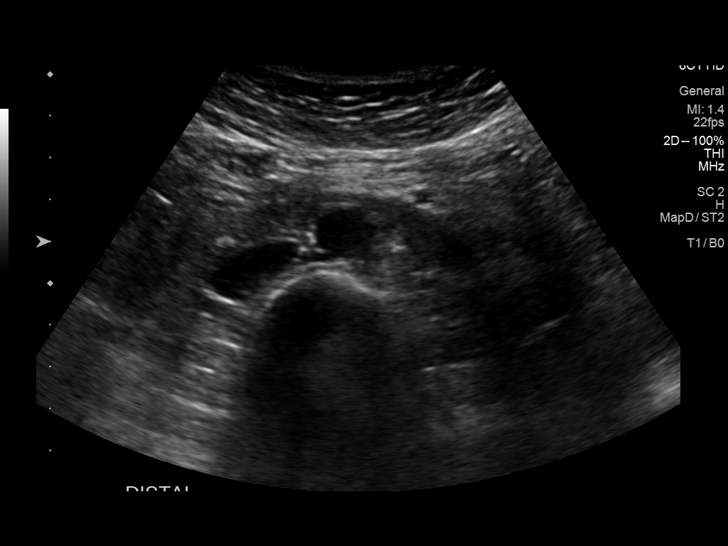
[im 9/16]
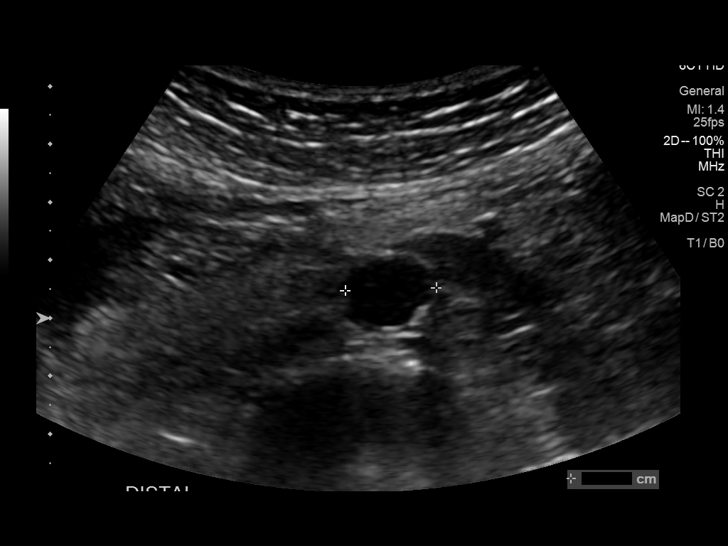
[im 10/16]
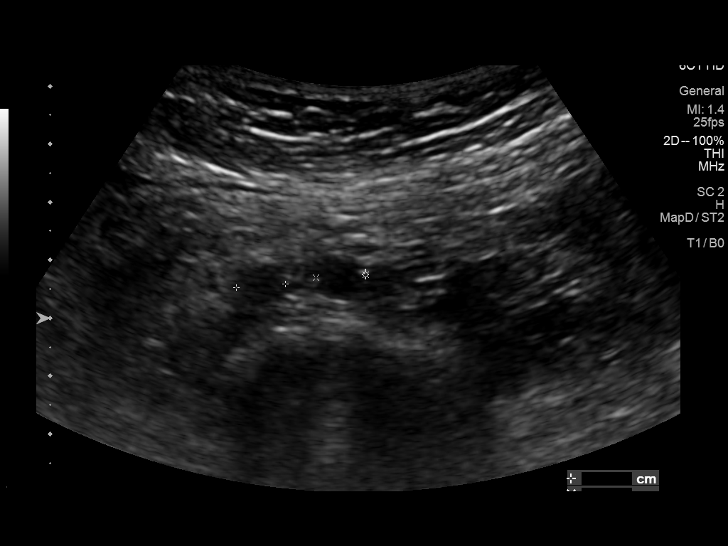
[im 11/16]
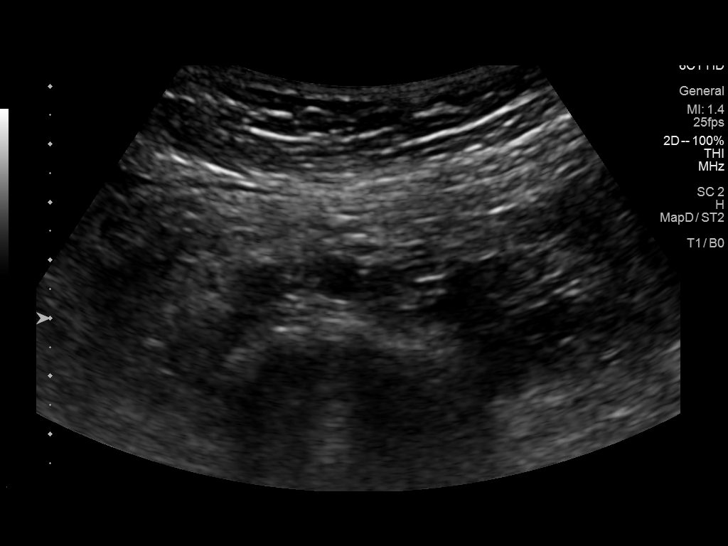
[im 13/16]
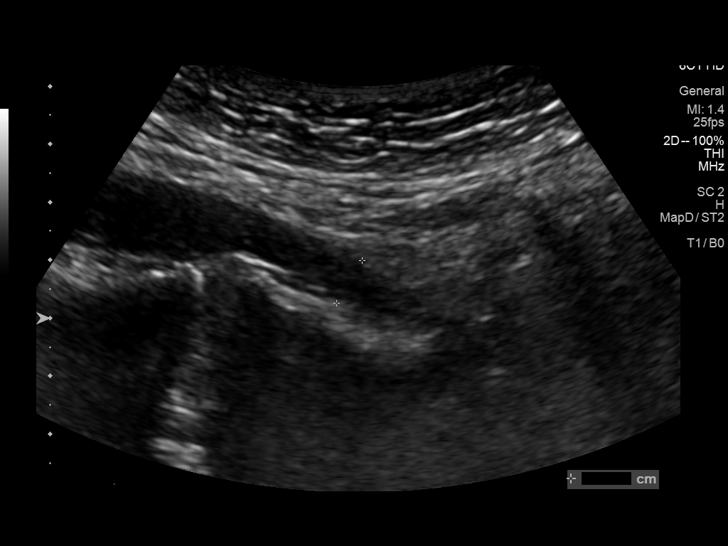
[im 14/16]
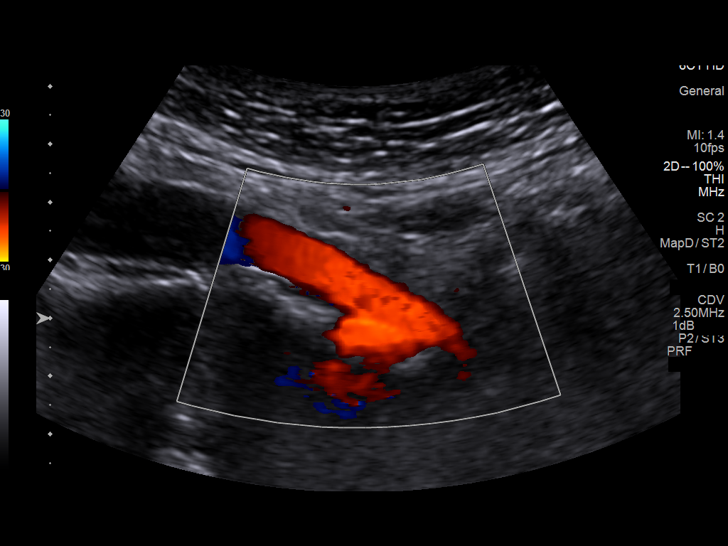
[im 15/16]
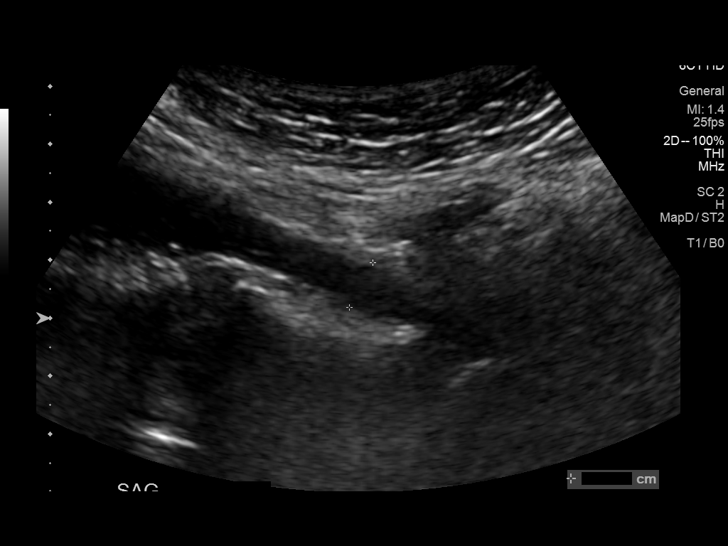
[im 16/16]
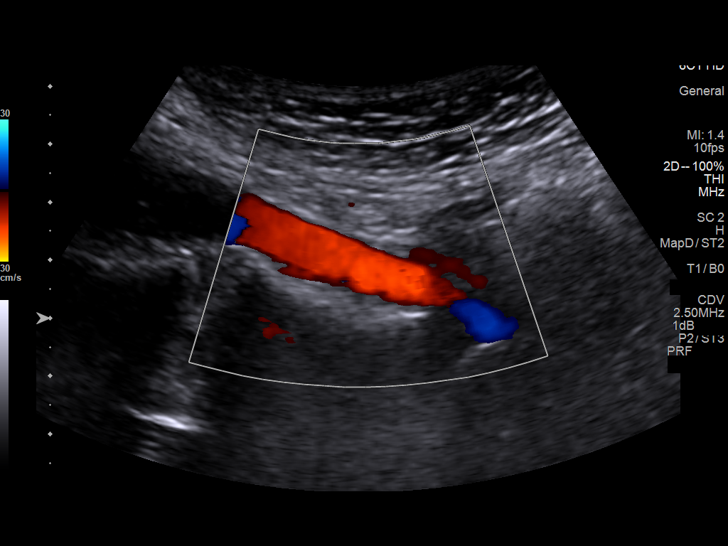

[14 of 16 positions shown; findings below may reference images not displayed]

FINDINGS: Abdominal aortic measurements as follows:

Proximal:  2 x 1.8 cm

Mid:  1.6 x 1.7 cm

Distal:  1.4 x 1.6 cm
Patent: Yes, peak systolic velocity is 129 cm/s

Right common iliac artery: 0.9 x 0.9 cm

Left common iliac artery: 0.9 x 0.9 cm
IMPRESSION: No evidence for an abdominal aortic aneurysm. No follow-up
recommended. This recommendation follows ACR consensus guidelines:
White Paper of the ACR Incidental Findings Committee II on Vascular
Findings. [HOSPITAL] 0189; [DATE].

## 2021-06-12 DIAGNOSIS — M79641 Pain in right hand: Secondary | ICD-10-CM | POA: Diagnosis not present

## 2021-06-25 DIAGNOSIS — M79641 Pain in right hand: Secondary | ICD-10-CM | POA: Diagnosis not present

## 2021-07-04 DIAGNOSIS — S61210A Laceration without foreign body of right index finger without damage to nail, initial encounter: Secondary | ICD-10-CM | POA: Diagnosis not present

## 2021-08-17 IMAGING — CT CT MAXILLOFACIAL W/O CM
3 of 5 series · 14 of 47 positions shown, 17 images · non-contrast
Comparison: None.

CLINICAL DATA: Fall

EXAM:
CT HEAD WITHOUT CONTRAST
CT MAXILLOFACIAL WITHOUT CONTRAST
CT CERVICAL SPINE WITHOUT CONTRAST
TECHNIQUE: Multidetector CT imaging of the head, cervical spine, and
maxillofacial structures were performed using the standard protocol
without intravenous contrast. Multiplanar CT image reconstructions
of the cervical spine and maxillofacial structures were also
generated.

[Series 4: maxilllofacial 2.0 hr40 3 · axial · 0.28mm/px · z∈[-210,-98]mm · 9 of 66 slices shown, 12 images]
[im 5/66  brain]
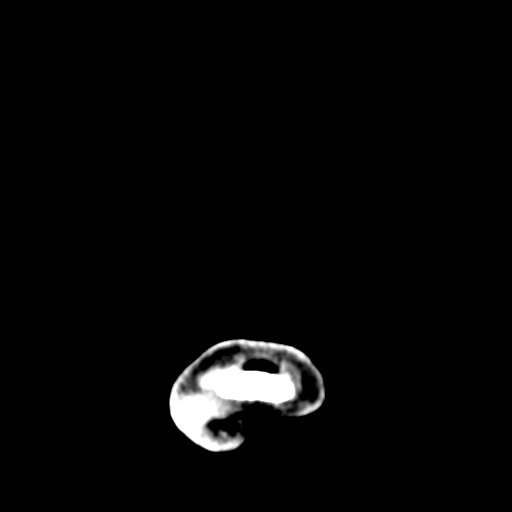
[im 5/66  bone]
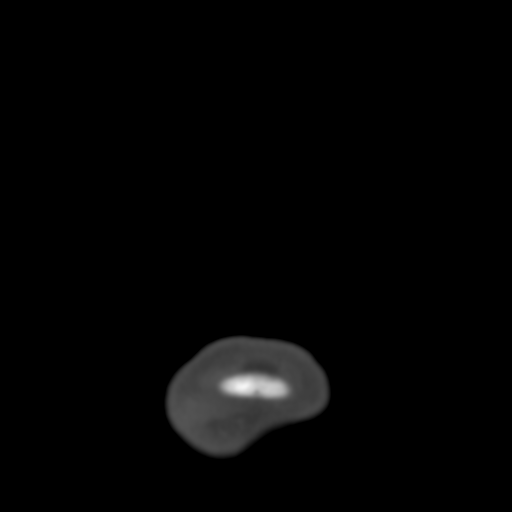
[im 14/66  bone]
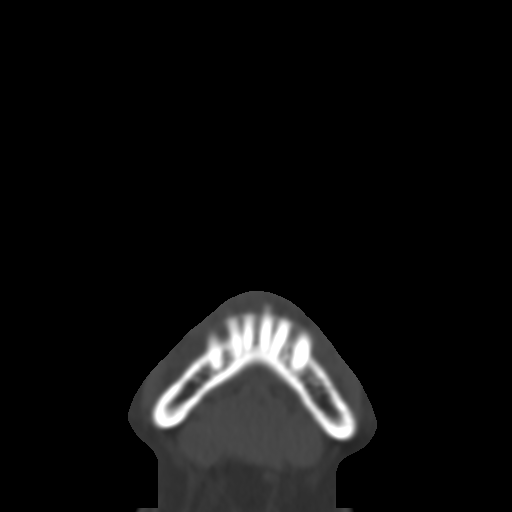
[im 19/66  bone]
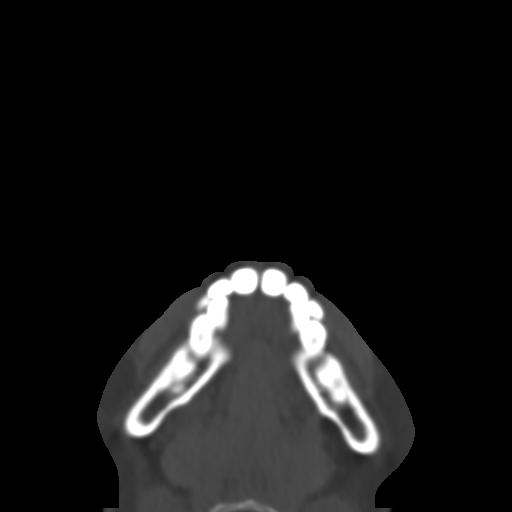
[im 28/66  bone]
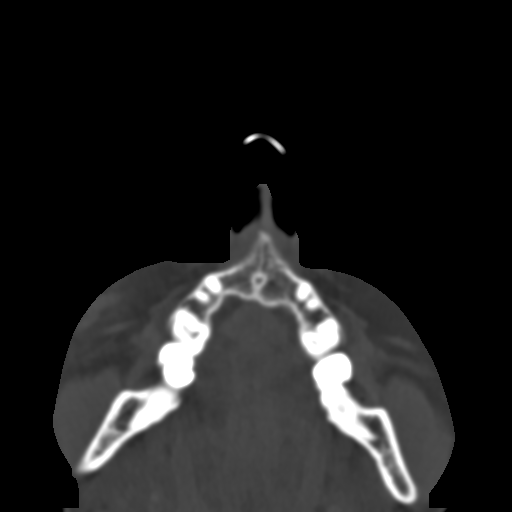
[im 33/66  brain]
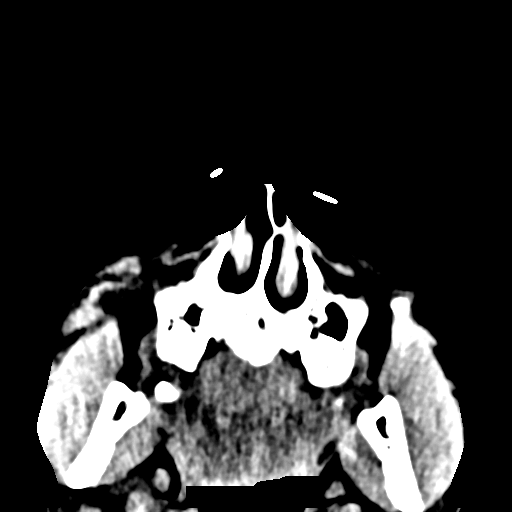
[im 33/66  bone]
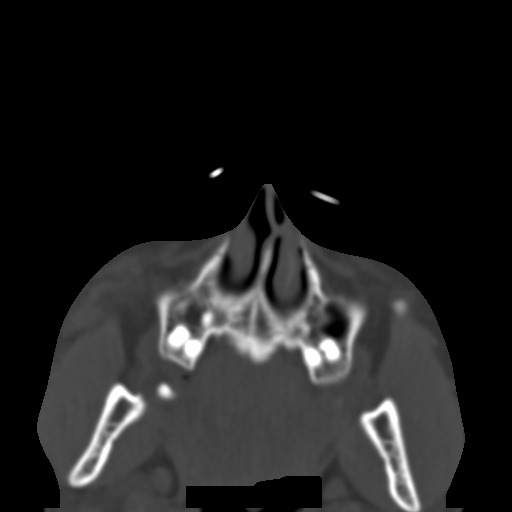
[im 38/66  bone]
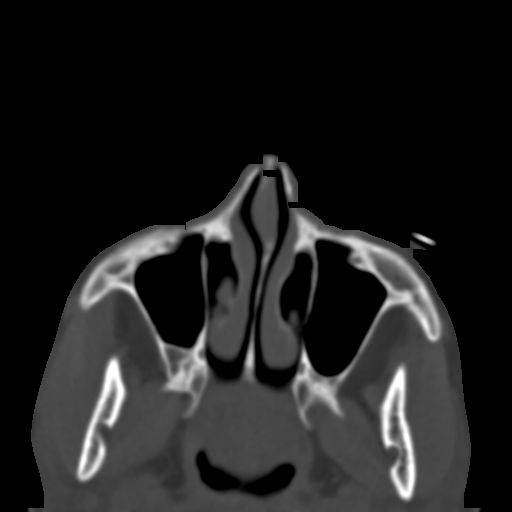
[im 47/66  bone]
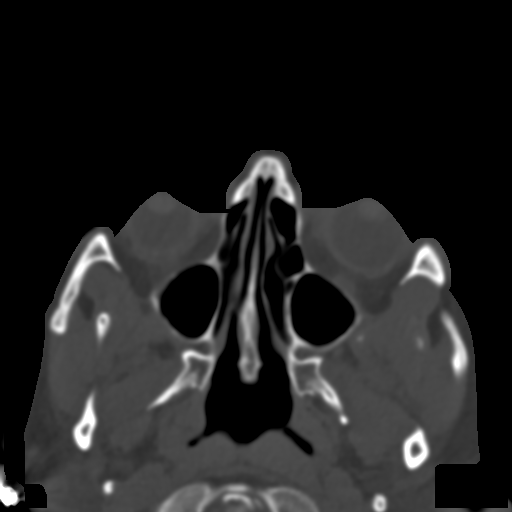
[im 52/66  bone]
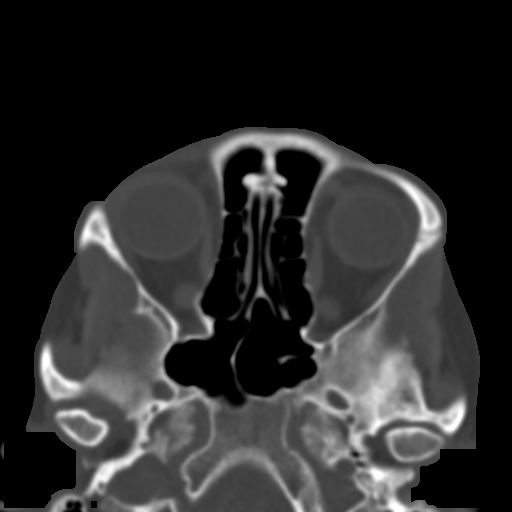
[im 61/66  brain]
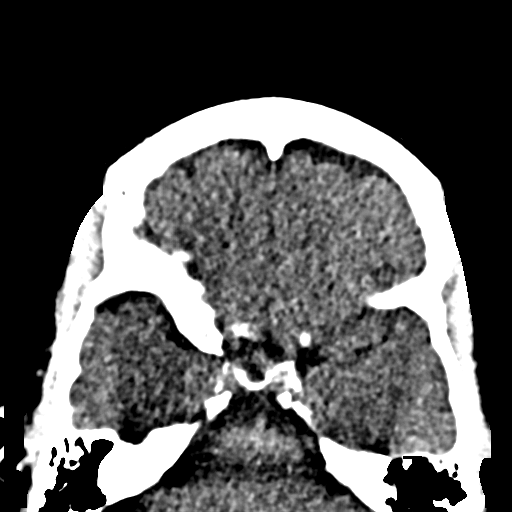
[im 61/66  bone]
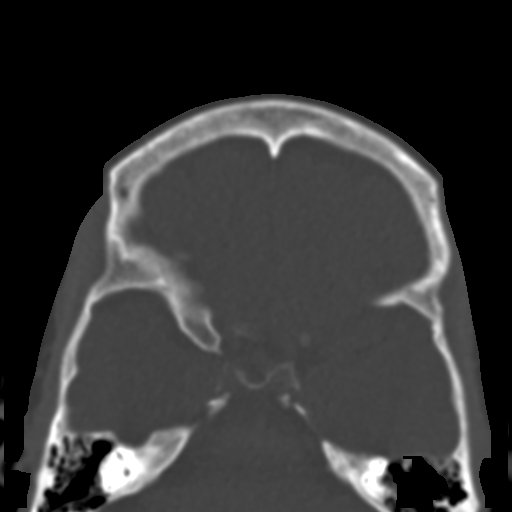

[Series 8: st cor · coronal · 0.32mm/px · 3 of 54 slices shown]
[im 18/54  bone]
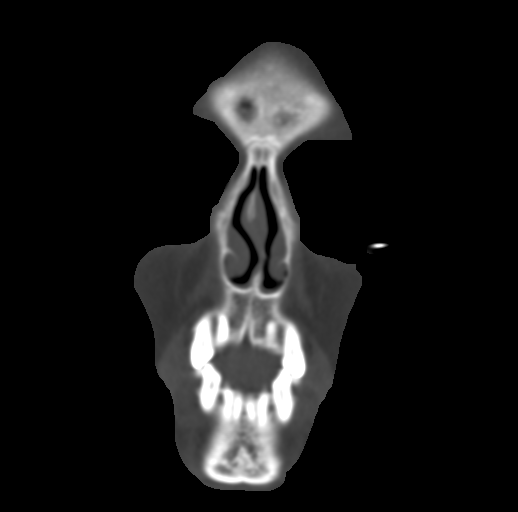
[im 24/54  bone]
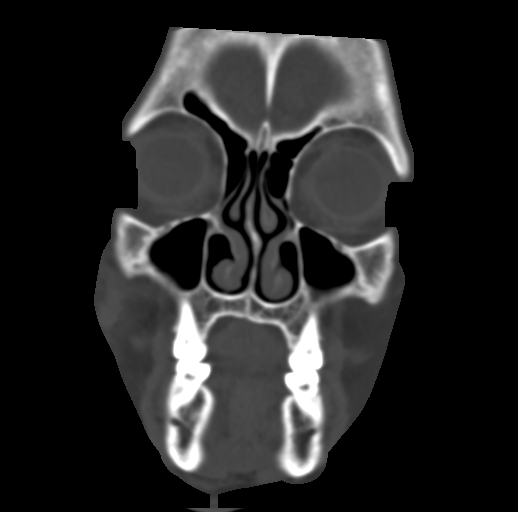
[im 30/54  bone]
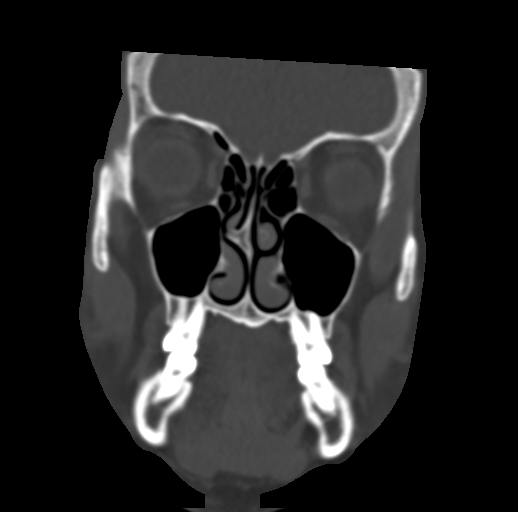

[Series 11: bone sag · sagittal · 0.23mm/px · 2 of 84 slices shown]
[im 28/84  bone]
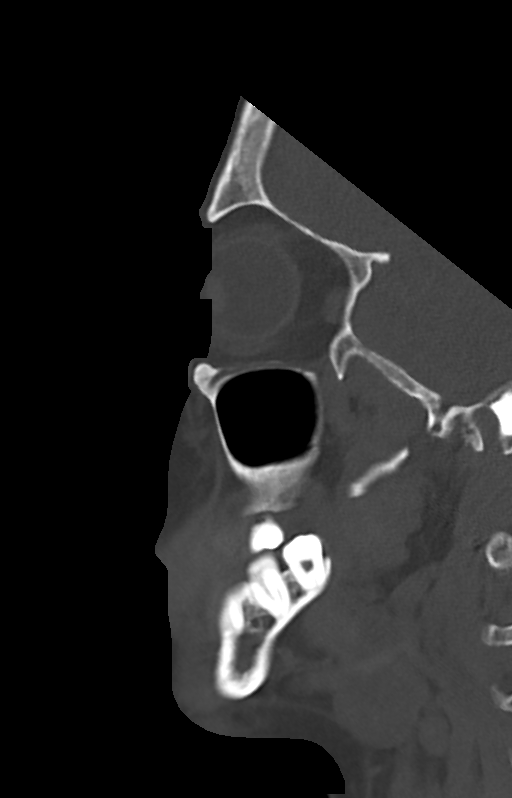
[im 56/84  bone]
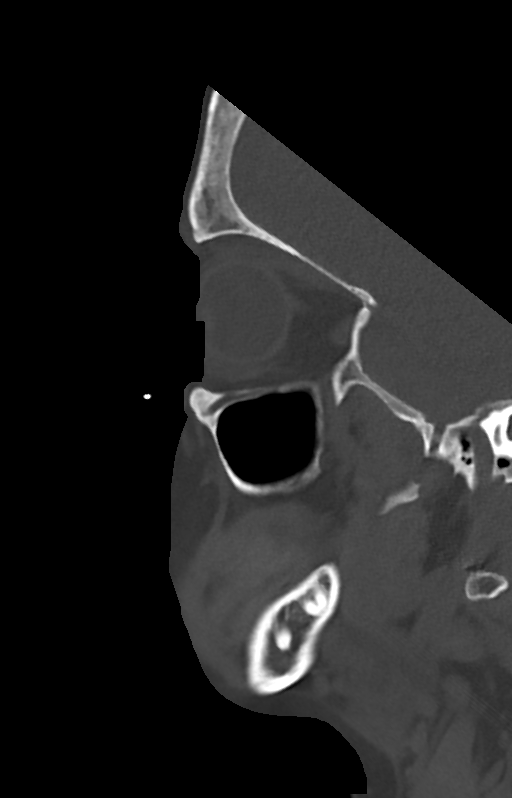

[14 of 47 positions shown; findings below may reference images not displayed]

FINDINGS: CT HEAD FINDINGS

Brain: There is no mass, hemorrhage or extra-axial collection. The
size and configuration of the ventricles and extra-axial CSF spaces
are normal. The brain parenchyma is normal, without evidence of
acute or chronic infarction.

Vascular: No abnormal hyperdensity of the major intracranial
arteries or dural venous sinuses. No intracranial atherosclerosis.

Skull: The visualized skull base, calvarium and extracranial soft
tissues are normal.

CT MAXILLOFACIAL FINDINGS

Osseous:

--Complex facial fracture types: No LeFort, zygomaticomaxillary
complex or nasoorbitoethmoidal fracture.

--Simple fracture types: None.

--Mandible: No fracture or dislocation.

Orbits: The globes are intact. Normal appearance of the intra- and
extraconal fat. Symmetric extraocular muscles and optic nerves.

Sinuses: No fluid levels or advanced mucosal thickening.

Soft tissues: Normal visualized extracranial soft tissues.

CT CERVICAL SPINE FINDINGS

Alignment: No static subluxation. Facets are aligned. Occipital
condyles and the lateral masses of C1-C2 are aligned.

Skull base and vertebrae: No acute fracture.

Soft tissues and spinal canal: No prevertebral fluid or swelling. No
visible canal hematoma.

Disc levels: No advanced spinal canal or neural foraminal stenosis.

Upper chest: No pneumothorax, pulmonary nodule or pleural effusion.

Other: Normal visualized paraspinal cervical soft tissues.
IMPRESSION: 1. No acute intracranial abnormality.
2. No facial fracture.
3. No acute fracture or static subluxation of the cervical spine.

## 2021-08-17 IMAGING — CT CT HEAD W/O CM
4 series · 15 of 47 positions shown, 17 images · non-contrast
Comparison: None.

CLINICAL DATA: Fall

EXAM:
CT HEAD WITHOUT CONTRAST
CT MAXILLOFACIAL WITHOUT CONTRAST
CT CERVICAL SPINE WITHOUT CONTRAST
TECHNIQUE: Multidetector CT imaging of the head, cervical spine, and
maxillofacial structures were performed using the standard protocol
without intravenous contrast. Multiplanar CT image reconstructions
of the cervical spine and maxillofacial structures were also
generated.

[Series 1: head wo · axial · 0.39mm/px · z∈[-104,-4]mm · 7 of 28 slices shown, 9 images]
[im 4/28  brain]
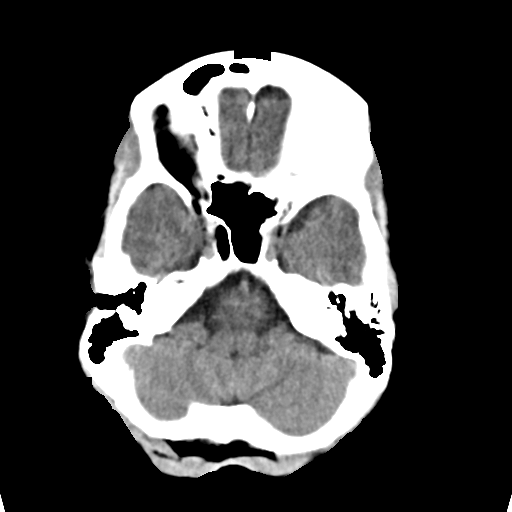
[im 4/28  bone]
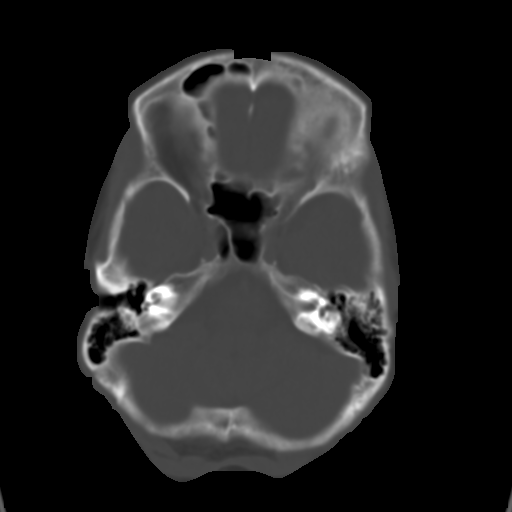
[im 7/28  brain]
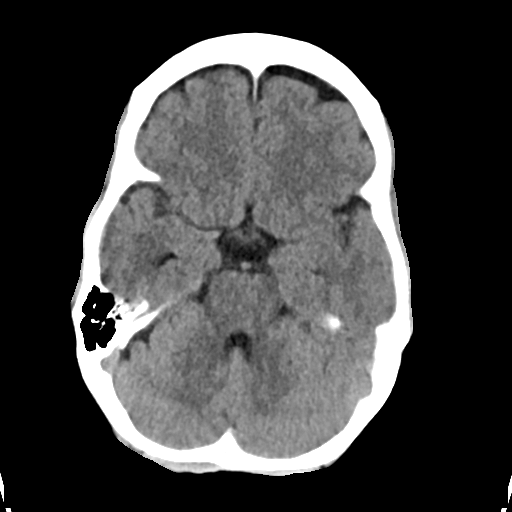
[im 11/28  brain]
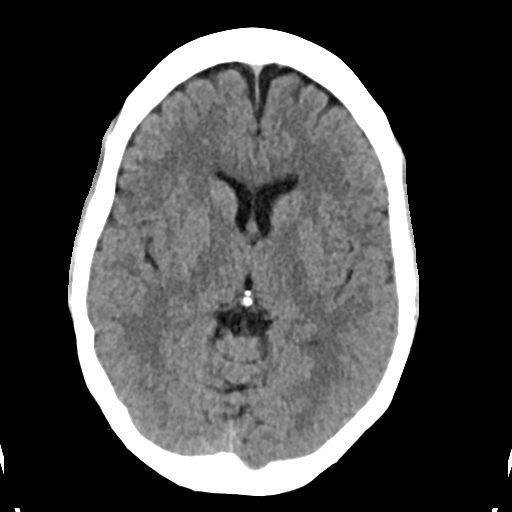
[im 14/28  brain]
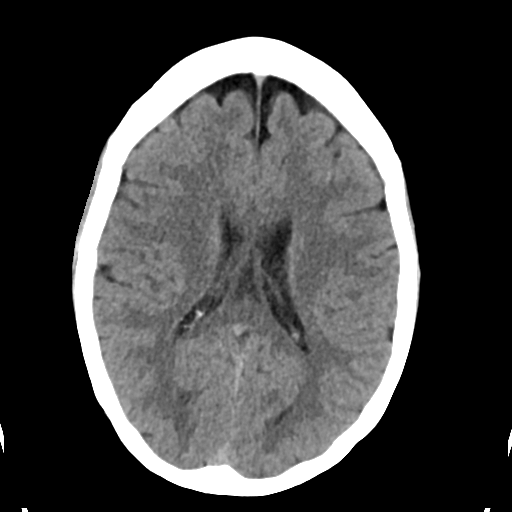
[im 17/28  brain]
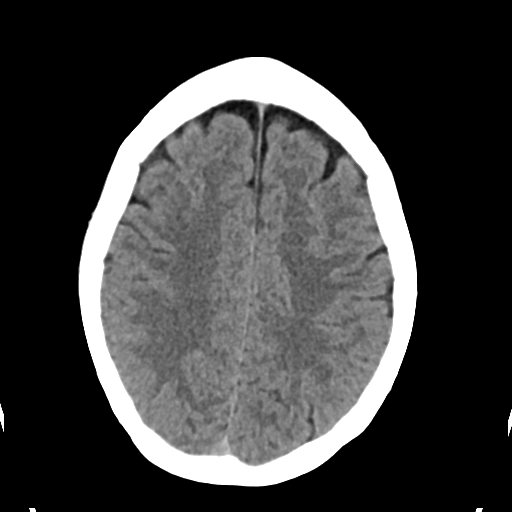
[im 17/28  bone]
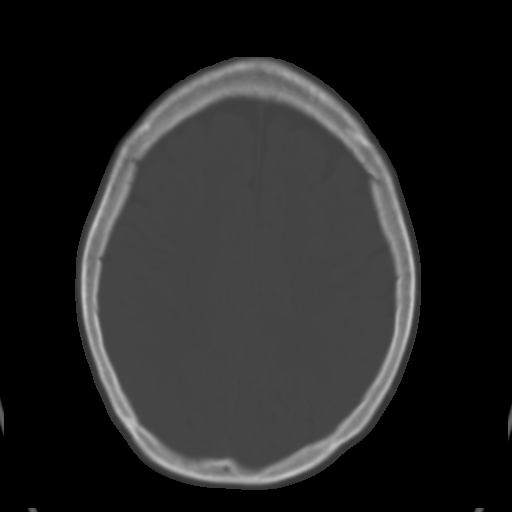
[im 21/28  brain]
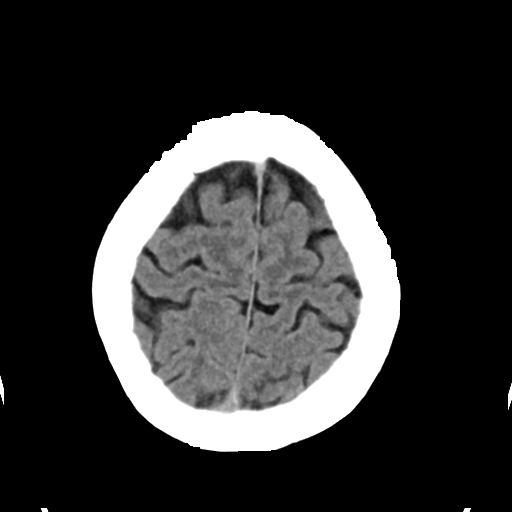
[im 24/28  brain]
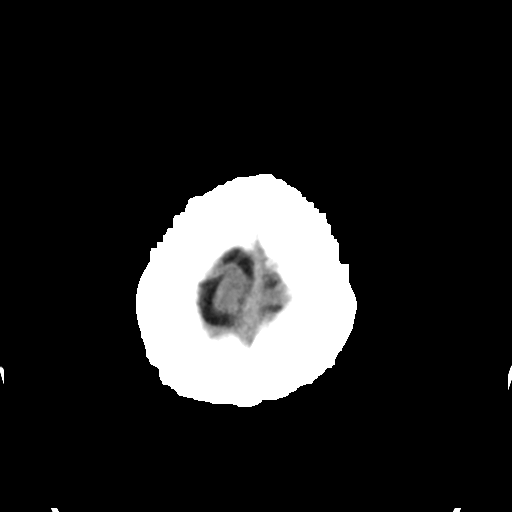

[Series 4: head bone · axial · 0.39mm/px · z∈[-107,-93]mm · 2 of 70 slices shown]
[im 7/70  bone]
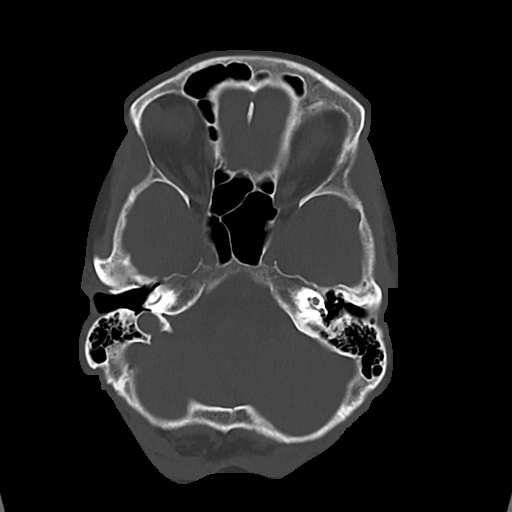
[im 14/70  bone]
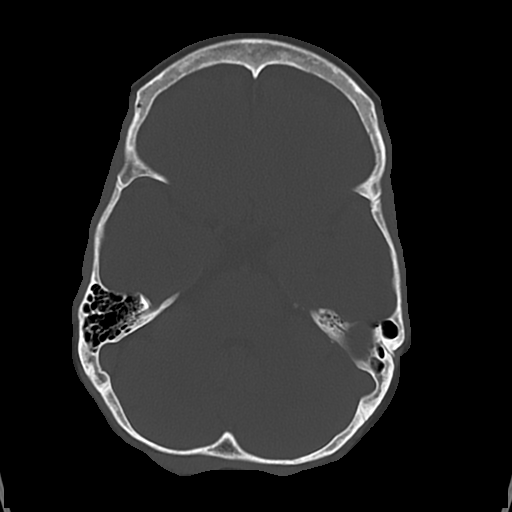

[Series 5: cor soft · coronal · 0.29mm/px · 3 of 67 slices shown]
[im 23/67  brain]
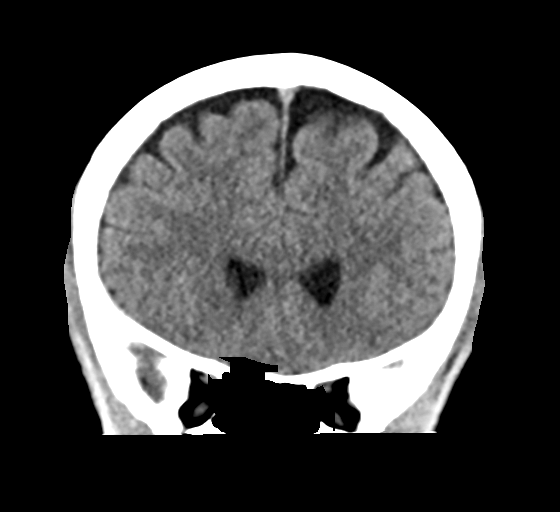
[im 30/67  brain]
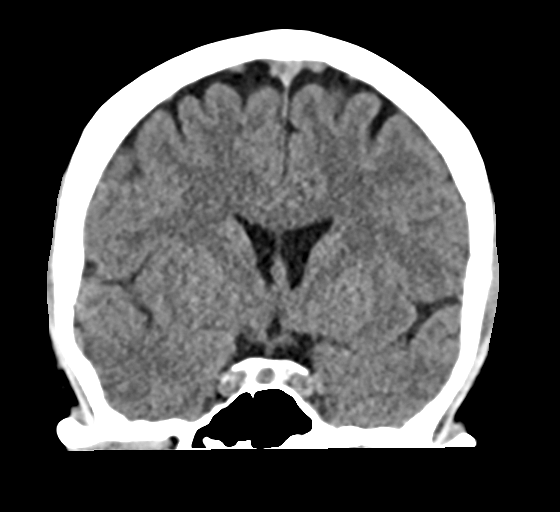
[im 37/67  brain]
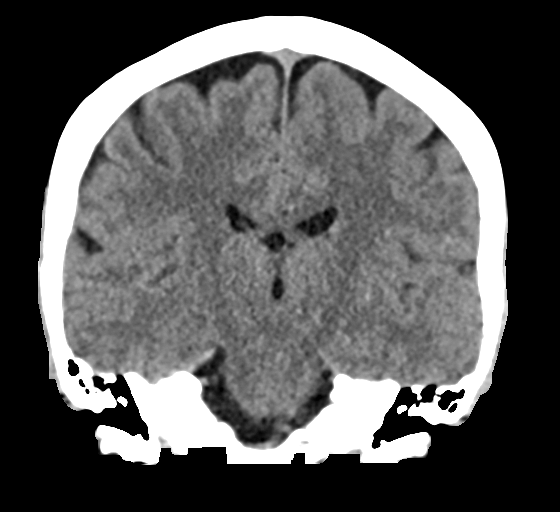

[Series 6: sag soft · sagittal · 0.29mm/px · 3 of 53 slices shown]
[im 18/53  brain]
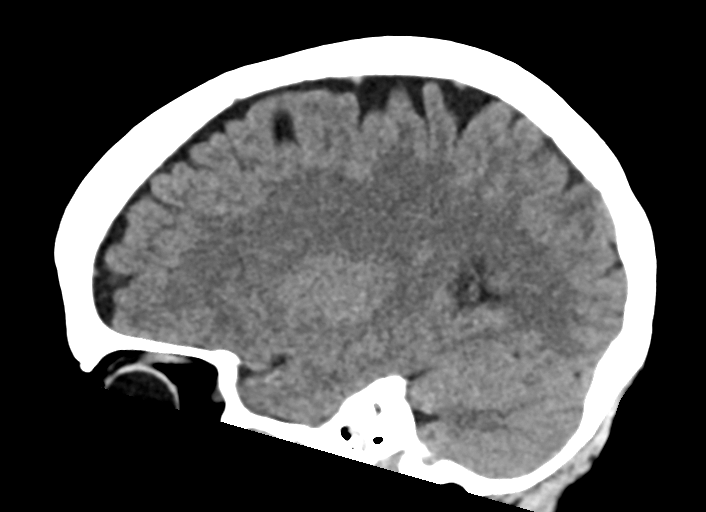
[im 27/53  brain]
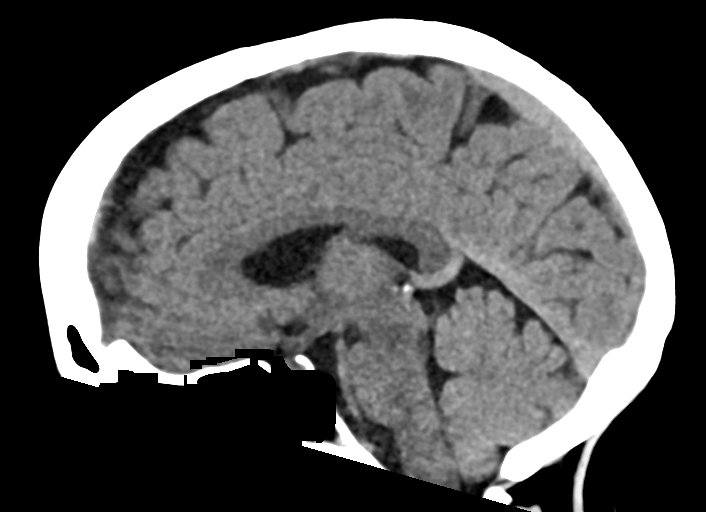
[im 35/53  brain]
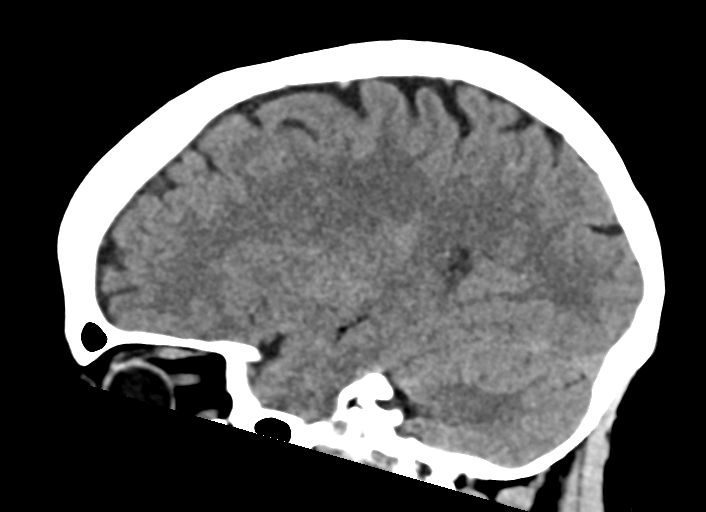

[15 of 47 positions shown; findings below may reference images not displayed]

FINDINGS: CT HEAD FINDINGS

Brain: There is no mass, hemorrhage or extra-axial collection. The
size and configuration of the ventricles and extra-axial CSF spaces
are normal. The brain parenchyma is normal, without evidence of
acute or chronic infarction.

Vascular: No abnormal hyperdensity of the major intracranial
arteries or dural venous sinuses. No intracranial atherosclerosis.

Skull: The visualized skull base, calvarium and extracranial soft
tissues are normal.

CT MAXILLOFACIAL FINDINGS

Osseous:

--Complex facial fracture types: No [place], zygomaticomaxillary
complex or nasoorbitoethmoidal fracture.

--Simple fracture types: None.

--Mandible: No fracture or dislocation.

Orbits: The globes are intact. Normal appearance of the intra- and
extraconal fat. Symmetric extraocular muscles and optic nerves.

Sinuses: No fluid levels or advanced mucosal thickening.

Soft tissues: Normal visualized extracranial soft tissues.

CT CERVICAL SPINE FINDINGS

Alignment: No static subluxation. Facets are aligned. Occipital
condyles and the lateral masses of C1-C2 are aligned.

Skull base and vertebrae: No acute fracture.

Soft tissues and spinal canal: No prevertebral fluid or swelling. No
visible canal hematoma.

Disc levels: No advanced spinal canal or neural foraminal stenosis.

Upper chest: No pneumothorax, pulmonary nodule or pleural effusion.

Other: Normal visualized paraspinal cervical soft tissues.
IMPRESSION: 1. No acute intracranial abnormality.
2. No facial fracture.
3. No acute fracture or static subluxation of the cervical spine.

## 2022-10-28 DIAGNOSIS — F40243 Fear of flying: Secondary | ICD-10-CM | POA: Diagnosis not present

## 2022-11-11 ENCOUNTER — Other Ambulatory Visit: Payer: Self-pay | Admitting: Family Medicine

## 2022-11-11 DIAGNOSIS — Z1231 Encounter for screening mammogram for malignant neoplasm of breast: Secondary | ICD-10-CM

## 2022-12-05 ENCOUNTER — Ambulatory Visit
Admission: RE | Admit: 2022-12-05 | Discharge: 2022-12-05 | Disposition: A | Discharge status: Home / Self Care | Payer: 59 | Source: Ambulatory Visit | Attending: Family Medicine | Admitting: Family Medicine

## 2022-12-05 DIAGNOSIS — Z1231 Encounter for screening mammogram for malignant neoplasm of breast: Secondary | ICD-10-CM | POA: Diagnosis not present

## 2023-12-11 DIAGNOSIS — Z0000 Encounter for general adult medical examination without abnormal findings: Secondary | ICD-10-CM | POA: Diagnosis not present

## 2023-12-11 DIAGNOSIS — Z13220 Encounter for screening for lipoid disorders: Secondary | ICD-10-CM | POA: Diagnosis not present

## 2023-12-16 DIAGNOSIS — F40243 Fear of flying: Secondary | ICD-10-CM | POA: Diagnosis not present

## 2023-12-16 DIAGNOSIS — R002 Palpitations: Secondary | ICD-10-CM | POA: Diagnosis not present

## 2023-12-16 DIAGNOSIS — Z0000 Encounter for general adult medical examination without abnormal findings: Secondary | ICD-10-CM | POA: Diagnosis not present
